# Patient Record
Sex: Male | Born: 1980 | Hispanic: No | Marital: Single | Smoking: Never smoker
Health system: Southern US, Community
[De-identification: ages and names within clinical notes are randomized; demographics above are authoritative.]

## PROBLEM LIST (undated history)

## (undated) DIAGNOSIS — N200 Calculus of kidney: Secondary | ICD-10-CM

## (undated) HISTORY — DX: Calculus of kidney: N20.0

---

## 2007-11-17 ENCOUNTER — Emergency Department (HOSPITAL_COMMUNITY): Admission: EM | Admit: 2007-11-17 | Discharge: 2007-11-17 | Payer: Self-pay | Admitting: *Deleted

## 2009-01-05 ENCOUNTER — Emergency Department (HOSPITAL_COMMUNITY): Admission: EM | Admit: 2009-01-05 | Discharge: 2009-01-05 | Payer: Self-pay | Admitting: Family Medicine

## 2009-05-11 ENCOUNTER — Ambulatory Visit (HOSPITAL_COMMUNITY): Admission: RE | Admit: 2009-05-11 | Discharge: 2009-05-11 | Payer: Self-pay | Admitting: Urology

## 2009-12-29 IMAGING — CR DG ABDOMEN 1V
1 series · 1 of 1 positions shown · non-contrast
Comparison: None

CLINICAL DATA: Preop for right ureteral stone

ABDOMEN - 1 VIEW

[t abdomen supine]
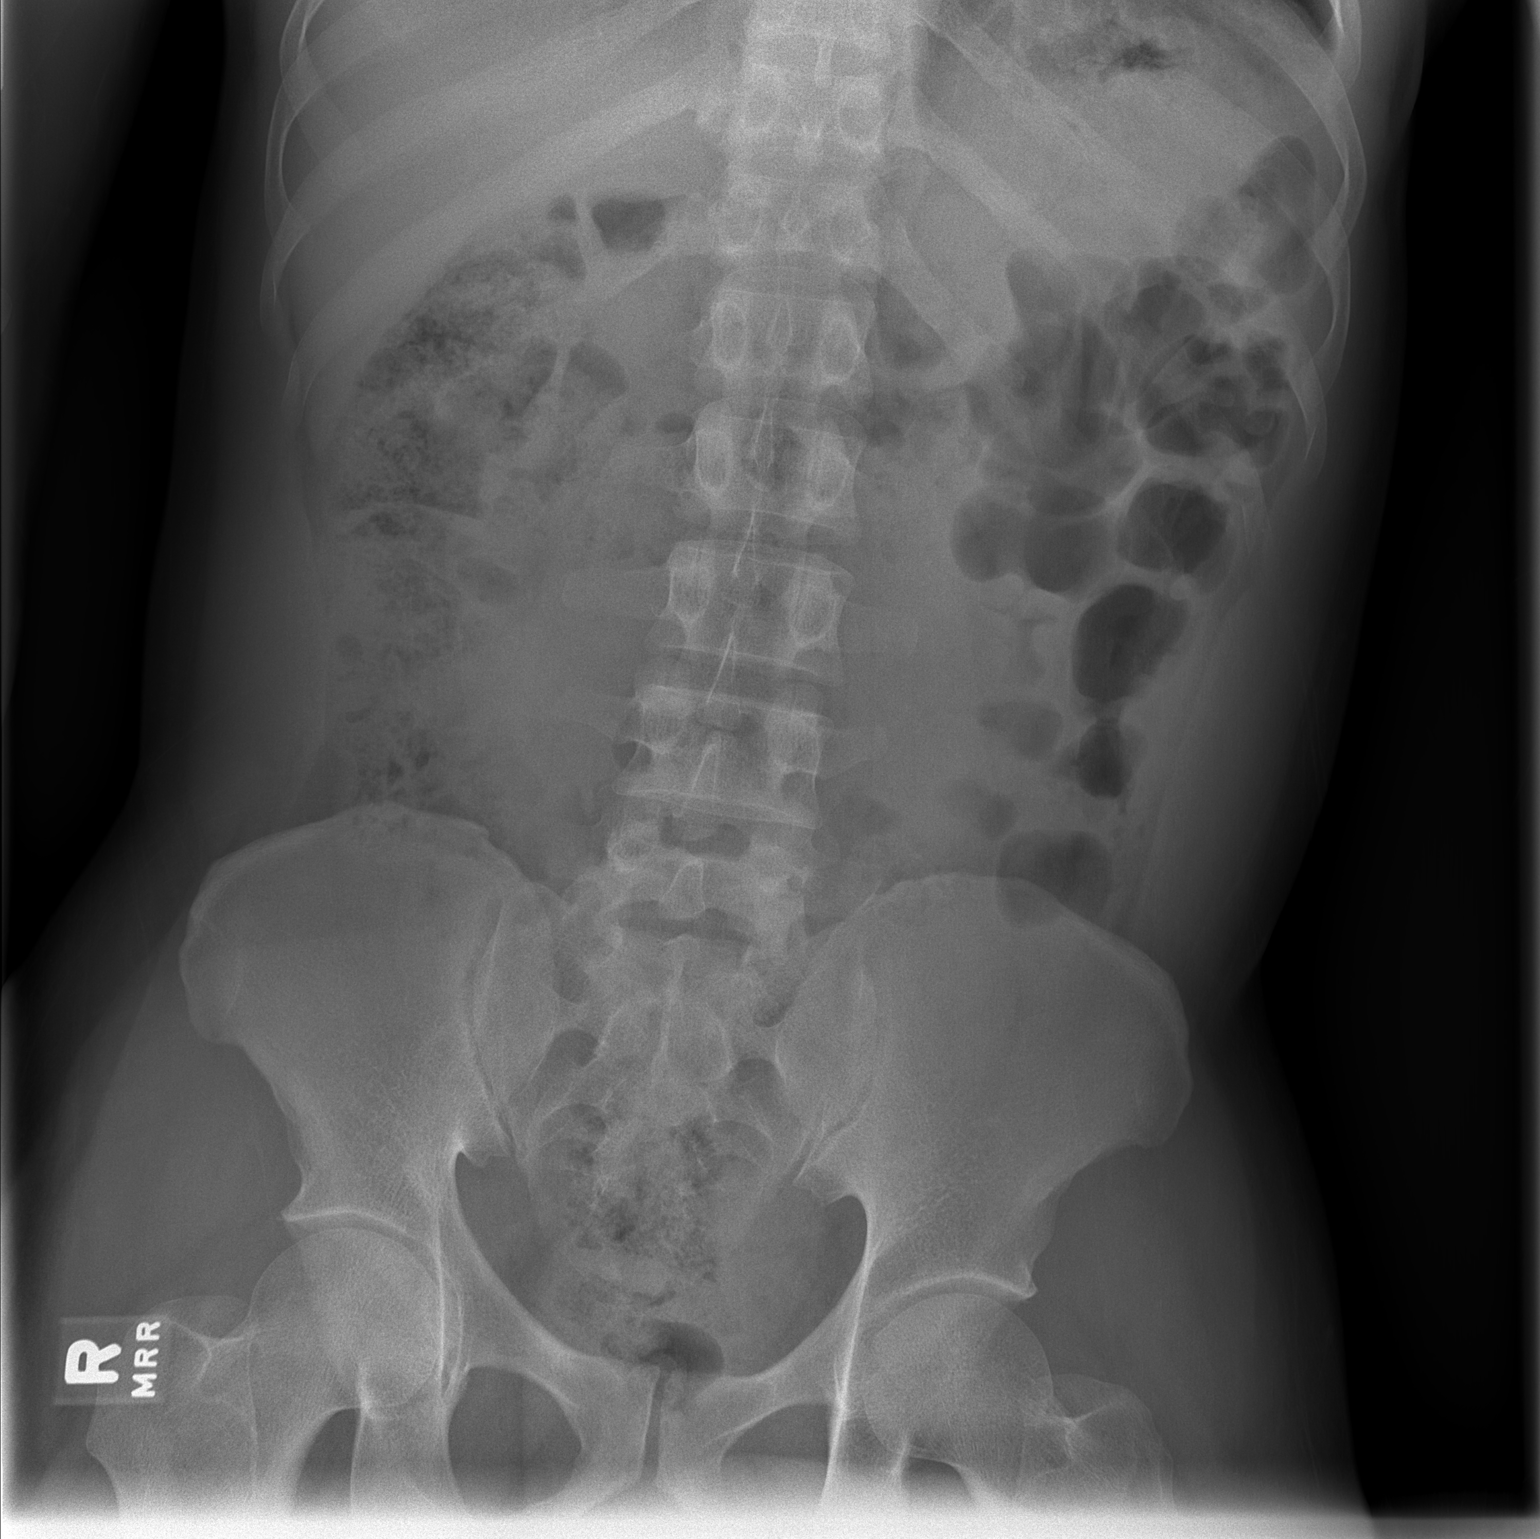

[1 of 1 positions shown; findings below may reference images not displayed]

FINDINGS: The bowel gas pattern is nonspecific.  No definite renal
calculi are seen although both renal outlines are overlain by bowel
gas and feces obscuring detail.  No definite calcification is seen
along the expected courses of the ureters.  The bones appear
normal.
IMPRESSION: Nonspecific bowel gas pattern.  No definite opaque calculus is
seen.  Recommend direct correlation with any prior CT of the
abdomen pelvis.

## 2010-12-17 ENCOUNTER — Other Ambulatory Visit: Payer: Self-pay | Admitting: Occupational Medicine

## 2010-12-17 ENCOUNTER — Ambulatory Visit: Payer: Self-pay

## 2010-12-17 DIAGNOSIS — R7611 Nonspecific reaction to tuberculin skin test without active tuberculosis: Secondary | ICD-10-CM

## 2011-03-25 NOTE — Op Note (Signed)
NAMERUMEAL, CULLIPHER              ACCOUNT NO.:  [id number]   MEDICAL RECORD NO.:  [id number]          PATIENT TYPE:  AMB   LOCATION:  DAY                          FACILITY:  [facility]   PHYSICIAN:  Mark C. Vernie Ammons, M.D.  DATE OF BIRTH:  1981-08-17   DATE OF PROCEDURE:  05/11/2009  DATE OF DISCHARGE:                               OPERATIVE REPORT   PREOPERATIVE DIAGNOSIS:  Right ureteral calculus.   POSTOPERATIVE DIAGNOSIS:  Right ureteral calculus.   PROCEDURES:  1. Cystoscopy with right ureteral catheterization.  2. Right retrograde pyelogram with interpretation.  3. Right ureteroscopy.  4. In situ laser lithotripsy.  5. Right ureteral stone extraction.  6. Right double-J stent placement (5-French, 24 cm with string).   SURGEON:  Dr. Vernie Ammons.   ANESTHESIA:  General.   SPECIMENS:  Stone given to the patient.   BLOOD LOSS:  Minimal.   DRAINS:  Polaris stent as above.   COMPLICATIONS:  None.   INDICATIONS:  The patient is a 30 year old male who was found to have a  6.5 mm right ureteral calculus after experiencing abdominal and right  lower quadrant pain.  Follow-up KUB revealed that the stone was not  easily identifiable along the course of the ureter, however, follow-up  yesterday revealed a change in the patient's symptoms in that he was  starting to have irritative voiding symptoms.  A KUB revealed a density  near the ureterovesical junction.  The patient has been out of work  since he began having difficulty with the stone and his employer would  not let him return until the stone was passed or treated.  He is  therefore brought to the operating room today for ureteroscopic  extraction of his stone and I have discussed the procedure, its risks  and complications, as well as alternatives.  The patient understands and  has elected to proceed.   DESCRIPTION OF OPERATION:  After informed consent, the patient was  brought to the major OR, placed on the table, administered  general  anesthesia, then moved to the dorsal lithotomy position.  His genitalia  was sterilely prepped and draped and an official timeout was then  performed.   A 22-French cystoscope with 12 degrees lens was then passed per urethra  which was noted to be entirely normal throughout its length.  The  prostatic urethra was noted to be nonobstructing and had no lesions.  Upon entering the bladder, I noted normal-appearing mucosa and upon full  systematic inspection, I noted no tumor, stones or inflammatory lesions.  The ureteral orifices were noted to be in  normal position.  The left  orifice appeared normal.  The right orifice appeared edematous and  slightly mounded up, consistent with an intramural ureteral calculus.   A 6-French open-ended ureteral catheter was then passed into the opening  of the right ureteral orifice and under direct fluoroscopic  visualization, full-strength contrast was injected to perform a  retrograde pyelogram.  This revealed a filling defect in the distal  ureter, but a normal ureter proximal to that with no filling defects or  masses.  The intrarenal  collecting system was also noted to be normal.   A 0.038 inch floppy tip guidewire was then passed up the ureter under  fluoroscopic control into the area of the renal pelvis and the open-  ended catheter was removed.  The end portion of a ureteral access sheath  was then passed over the guidewire to gently dilate the intramural  ureter.  I then removed that and performed ureteroscopy.   The 6-French rigid ureteroscope was then passed next to the guidewire  and into the right ureteral orifice where the stone was identified.  A  200 micron holmium laser fiber was then used to fragment the stone.   The nitinol basket was then passed through the ureteroscope and the  stone fragments were grasped and extracted without difficulty.  I then  re-inspected the ureter and noted no injury or perforation.  No residual   fragments remained.   The guidewire was left in place and after the ureteroscope was removed,  the cystoscope was back loaded over the guidewire and the stent was  passed over the guidewire and as the guidewire was removed, a good curl  was noted in the area of the renal pelvis.  The bladder was then  drained, the urethra was instilled with 2% lidocaine jelly and a penile  clamp was applied and the string attached to the distal to distal  portion of the stent was then affixed to the dorsum of the penis.  The  patient was awakened and taken to the recovery room in stable and  satisfactory condition.  He tolerated the procedure well and there were  no intraoperative complications.   He will be discharged home with Pyridium 200 mg #36 and Vicodin HP #24.  He was given written instructions and will follow-up in my office next  week for his stent removal.      Mark C. Vernie Ammons, M.D.  Electronically Signed     MCO/MEDQ  D:  05/11/2009  T:  05/11/2009  Job:  [id number]

## 2014-01-14 ENCOUNTER — Emergency Department (HOSPITAL_COMMUNITY): Payer: 59

## 2014-01-14 ENCOUNTER — Emergency Department (HOSPITAL_COMMUNITY)
Admission: EM | Admit: 2014-01-14 | Discharge: 2014-01-15 | Disposition: A | Discharge status: Home / Self Care | Payer: 59 | Attending: Emergency Medicine | Admitting: Emergency Medicine

## 2014-01-14 ENCOUNTER — Encounter (HOSPITAL_COMMUNITY): Payer: Self-pay | Admitting: Emergency Medicine

## 2014-01-14 DIAGNOSIS — R61 Generalized hyperhidrosis: Secondary | ICD-10-CM | POA: Insufficient documentation

## 2014-01-14 DIAGNOSIS — Z8701 Personal history of pneumonia (recurrent): Secondary | ICD-10-CM | POA: Insufficient documentation

## 2014-01-14 DIAGNOSIS — R0789 Other chest pain: Secondary | ICD-10-CM

## 2014-01-14 DIAGNOSIS — R509 Fever, unspecified: Secondary | ICD-10-CM | POA: Insufficient documentation

## 2014-01-14 LAB — I-STAT CHEM 8, ED
BUN: 20 mg/dL (ref 6–23)
CALCIUM ION: 1.24 mmol/L — AB (ref 1.12–1.23)
CREATININE: 1.3 mg/dL (ref 0.50–1.35)
Chloride: 102 mEq/L (ref 96–112)
GLUCOSE: 91 mg/dL (ref 70–99)
HEMATOCRIT: 44 % (ref 39.0–52.0)
HEMOGLOBIN: 15 g/dL (ref 13.0–17.0)
Potassium: 3.7 mEq/L (ref 3.7–5.3)
Sodium: 142 mEq/L (ref 137–147)
TCO2: 28 mmol/L (ref 0–100)

## 2014-01-14 NOTE — ED Provider Notes (Signed)
CSN: [id number]     Arrival date & time 01/14/14  2156 History  This chart was scribed for non-physician practitioner working with Sunnie Nielsen, MD by Ashley Jacobs, ED scribe. This patient was seen in room WTR8/WTR8 and the patient's care was started at 11:06 PM.  First MD Initiated Contact with Patient 01/14/14 2256     Chief Complaint  Patient presents with  . Chest Congestion      (Consider location/radiation/quality/duration/timing/severity/associated sxs/prior Treatment) The history is provided by the patient, medical records and the spouse. No language interpreter was used.   HPI Comments: Jonathan Nicholson is a 33 y.o. male who presents to the Emergency Department complaining of intermittent upper chest pain.   He describes the pain as pressure and nothing seems to make the pain worse.  Pt was treated for pneumonia  three-four weeks ago and he reports the pain is similar in nature. He mention that he experiences chills and diaphoresis that occurs at night. Denies coughing blood sputum.  Denies cough, wheezing, and SOB. Denies being in any current pain. Nothing makes the pain worse. He has tried Aleve and reports having mild relief. The pain does not radiate to his back. Denies abdominal pain. History reviewed. No pertinent past medical history. History reviewed. No pertinent past surgical history. No family history on file. History  Substance Use Topics  . Smoking status: Never Smoker   . Smokeless tobacco: Never Used  . Alcohol Use: No    Review of Systems  Constitutional: Positive for fever and diaphoresis.  Respiratory: Negative for cough, shortness of breath and wheezing.   Cardiovascular: Positive for chest pain.  Gastrointestinal: Negative for abdominal pain.  Musculoskeletal: Negative for gait problem.  All other systems reviewed and are negative.      Allergies  Review of patient's allergies indicates no known allergies.  Home Medications  No current outpatient  prescriptions on file. BP 125/70  Pulse 67  Temp(Src) 97.8 F (36.6 C) (Oral)  Resp 18  SpO2 99% Physical Exam  Nursing note and vitals reviewed. Constitutional: He is oriented to person, place, and time. He appears well-developed and well-nourished. No distress.  HENT:  Head: Normocephalic and atraumatic.  Right Ear: External ear normal.  Left Ear: External ear normal.  Nose: Nose normal.  Mouth/Throat: Oropharynx is clear and moist. No oropharyngeal exudate.  Eyes: Conjunctivae are normal. Pupils are equal, round, and reactive to light. No scleral icterus.  Neck: Normal range of motion. Neck supple.  Cardiovascular: Normal rate, regular rhythm and normal heart sounds.  Exam reveals no gallop and no friction rub.   No murmur heard. Pulmonary/Chest: Effort normal and breath sounds normal. No respiratory distress. He has no wheezes. He has no rales. He exhibits no tenderness.  Abdominal: Soft. Bowel sounds are normal. He exhibits no distension. There is no tenderness. There is no rebound and no guarding.  Musculoskeletal: Normal range of motion. He exhibits no edema and no tenderness.  Lymphadenopathy:    He has no cervical adenopathy.  Neurological: He is alert and oriented to person, place, and time. He exhibits normal muscle tone. Coordination normal.  Skin: Skin is warm and dry. No rash noted. No erythema. No pallor.  Psychiatric: He has a normal mood and affect. His behavior is normal. Judgment and thought content normal.    ED Course  Procedures (including critical care time) DIAGNOSTIC STUDIES: Oxygen Saturation is 99% on room air, normal by my interpretation.    COORDINATION OF CARE:  11:10 PM  Discussed course of care with pt which includes chest x-ray, EKG and laboratory tests. Pt understands and agrees.   Labs Review Labs Reviewed - No data to display Imaging Review No results found.   EKG Interpretation None     Orders placed during the hospital encounter of  01/14/14  . ED EKG  . ED EKG  . EKG 12-LEAD  . EKG 12-LEAD   Date: 01/14/2014  Rate: 69  Rhythm: normal sinus rhythm  QRS Axis: normal  Intervals: normal  ST/T Wave abnormalities: normal  Conduction Disutrbances:none  Narrative Interpretation: LVH, otherwise normal EKG, reviewed by Dr. Ruthy DickZachowski  Old EKG Reviewed: none available Results for orders placed during the hospital encounter of 01/14/14  CBC WITH DIFFERENTIAL      Result Value Ref Range   WBC 4.8  4.0 - 10.5 K/uL   RBC 4.97  4.22 - 5.81 MIL/uL   Hemoglobin 13.7  13.0 - 17.0 g/dL   HCT 16.140.6  09.639.0 - 04.552.0 %   MCV 81.7  78.0 - 100.0 fL   MCH 27.6  26.0 - 34.0 pg   MCHC 33.7  30.0 - 36.0 g/dL   RDW 40.913.6  81.111.5 - 91.415.5 %   Platelets 249  150 - 400 K/uL   Neutrophils Relative % 33 (*) 43 - 77 %   Neutro Abs 1.6 (*) 1.7 - 7.7 K/uL   Lymphocytes Relative 55 (*) 12 - 46 %   Lymphs Abs 2.6  0.7 - 4.0 K/uL   Monocytes Relative 9  3 - 12 %   Monocytes Absolute 0.4  0.1 - 1.0 K/uL   Eosinophils Relative 3  0 - 5 %   Eosinophils Absolute 0.1  0.0 - 0.7 K/uL   Basophils Relative 0  0 - 1 %   Basophils Absolute 0.0  0.0 - 0.1 K/uL  I-STAT CHEM 8, ED      Result Value Ref Range   Sodium 142  137 - 147 mEq/L   Potassium 3.7  3.7 - 5.3 mEq/L   Chloride 102  96 - 112 mEq/L   BUN 20  6 - 23 mg/dL   Creatinine, Ser 7.821.30  0.50 - 1.35 mg/dL   Glucose, Bld 91  70 - 99 mg/dL   Calcium, Ion 9.561.24 (*) 1.12 - 1.23 mmol/L   TCO2 28  0 - 100 mmol/L   Hemoglobin 15.0  13.0 - 17.0 g/dL   HCT 21.344.0  08.639.0 - 57.852.0 %  I-STAT TROPOININ, ED      Result Value Ref Range   Troponin i, poc 0.00  0.00 - 0.08 ng/mL   Comment 3            Dg Chest 2 View  01/14/2014   CLINICAL DATA:  Left-sided chest pain. Recent pneumonia. No chest complaints.  EXAM: CHEST  2 VIEW  COMPARISON:  DG CHEST 2 VIEW dated 12/17/2010  FINDINGS: The heart size and mediastinal contours are within normal limits. Both lungs are clear. The visualized skeletal structures are  unremarkable. No change from 2012.  IMPRESSION: No active cardiopulmonary disease.   Electronically Signed   By: Davonna BellingJohn  Curnes M.D.   On: 01/14/2014 23:06      MDM   Atypical chest pain  Patient here with left anterior chest pain that started about 4 days ago.  There is no evidence of CAD, x-ray negative for continued infection, no current risk factors for TB, patient without pain at this time.  Will follow up with  PCP.  I personally performed the services described in this documentation, which was scribed in my presence. The recorded information has been reviewed and is accurate.'     Scarlette Calico C. Marisue Humble, PA-C 01/15/14 0124

## 2014-01-14 NOTE — ED Notes (Addendum)
Pt reports having chest congestion with chest discomfort, which he had while diagnosed with pneumonia in January. Pt reports the chest discomfort improved with antibiotics, however now is returning. Pt is A/O x4, in NAD, and vitals are WDL.

## 2014-01-15 LAB — CBC WITH DIFFERENTIAL/PLATELET
BASOS PCT: 0 % (ref 0–1)
Basophils Absolute: 0 10*3/uL (ref 0.0–0.1)
EOS ABS: 0.1 10*3/uL (ref 0.0–0.7)
EOS PCT: 3 % (ref 0–5)
HCT: 40.6 % (ref 39.0–52.0)
HEMOGLOBIN: 13.7 g/dL (ref 13.0–17.0)
Lymphocytes Relative: 55 % — ABNORMAL HIGH (ref 12–46)
Lymphs Abs: 2.6 10*3/uL (ref 0.7–4.0)
MCH: 27.6 pg (ref 26.0–34.0)
MCHC: 33.7 g/dL (ref 30.0–36.0)
MCV: 81.7 fL (ref 78.0–100.0)
MONO ABS: 0.4 10*3/uL (ref 0.1–1.0)
MONOS PCT: 9 % (ref 3–12)
NEUTROS PCT: 33 % — AB (ref 43–77)
Neutro Abs: 1.6 10*3/uL — ABNORMAL LOW (ref 1.7–7.7)
Platelets: 249 10*3/uL (ref 150–400)
RBC: 4.97 MIL/uL (ref 4.22–5.81)
RDW: 13.6 % (ref 11.5–15.5)
WBC: 4.8 10*3/uL (ref 4.0–10.5)

## 2014-01-15 LAB — I-STAT TROPONIN, ED: Troponin i, poc: 0 ng/mL (ref 0.00–0.08)

## 2014-01-15 MED ORDER — IBUPROFEN 800 MG PO TABS: 800.0000 mg | ORAL_TABLET | Freq: Three times a day (TID) | ORAL | Status: AC

## 2014-01-15 NOTE — Discharge Instructions (Signed)
Chest Wall Pain °Chest wall pain is pain in or around the bones and muscles of your chest. It may take up to 6 weeks to get better. It may take longer if you must stay physically active in your work and activities.  °CAUSES  °Chest wall pain may happen on its own. However, it may be caused by: °· A viral illness like the flu. °· Injury. °· Coughing. °· Exercise. °· Arthritis. °· Fibromyalgia. °· Shingles. °HOME CARE INSTRUCTIONS  °· Avoid overtiring physical activity. Try not to strain or perform activities that cause pain. This includes any activities using your chest or your abdominal and side muscles, especially if heavy weights are used. °· Put ice on the sore area. °· Put ice in a plastic bag. °· Place a towel between your skin and the bag. °· Leave the ice on for 15-20 minutes per hour while awake for the first 2 days. °· Only take over-the-counter or prescription medicines for pain, discomfort, or fever as directed by your caregiver. °SEEK IMMEDIATE MEDICAL CARE IF:  °· Your pain increases, or you are very uncomfortable. °· You have a fever. °· Your chest pain becomes worse. °· You have new, unexplained symptoms. °· You have nausea or vomiting. °· You feel sweaty or lightheaded. °· You have a cough with phlegm (sputum), or you cough up blood. °MAKE SURE YOU:  °· Understand these instructions. °· Will watch your condition. °· Will get help right away if you are not doing well or get worse. °Document Released: 10/27/2005 Document Revised: 01/19/2012 Document Reviewed: 06/23/2011 °ExitCare® Patient Information ©2014 [employer]. °Musculoskeletal Pain °Musculoskeletal pain is muscle and boney aches and pains. These pains can occur in any part of the body. Your caregiver may treat you without knowing the cause of the pain. They may treat you if blood or urine tests, X-rays, and other tests were normal.  °CAUSES °There is often not a definite cause or reason for these pains. These pains may be caused by a type of  germ (virus). The discomfort may also come from overuse. Overuse includes working out too hard when your body is not fit. Boney aches also come from weather changes. Bone is sensitive to atmospheric pressure changes. °HOME CARE INSTRUCTIONS  °Ask when your test results will be ready. Make sure you get your test results. °Only take over-the-counter or prescription medicines for pain, discomfort, or fever as directed by your caregiver. If you were given medications for your condition, do not drive, operate machinery or power tools, or sign legal documents for 24 hours. Do not drink alcohol. Do not take sleeping pills or other medications that may interfere with treatment. °Continue all activities unless the activities cause more pain. When the pain lessens, slowly resume normal activities. Gradually increase the intensity and duration of the activities or exercise. °During periods of severe pain, bed rest may be helpful. Lay or sit in any position that is comfortable. °Putting ice on the injured area. °Put ice in a bag. °Place a towel between your skin and the bag. °Leave the ice on for 15 to 20 minutes, 3 to 4 times a day. °Follow up with your caregiver for continued problems and no reason can be found for the pain. If the pain becomes worse or does not go away, it may be necessary to repeat tests or do additional testing. Your caregiver may need to look further for a possible cause. °SEEK IMMEDIATE MEDICAL CARE IF: °You have pain that is getting worse   and is not relieved by medications. °You develop chest pain that is associated with shortness or breath, sweating, feeling sick to your stomach (nauseous), or throw up (vomit). °Your pain becomes localized to the abdomen. °You develop any new symptoms that seem different or that concern you. °MAKE SURE YOU:  °Understand these instructions. °Will watch your condition. °Will get help right away if you are not doing well or get worse. °Document Released: 10/27/2005  Document Revised: 01/19/2012 Document Reviewed: 07/01/2013 °ExitCare® Patient Information ©2014 [employer]. ° °

## 2014-01-15 NOTE — ED Provider Notes (Signed)
Medical screening examination/treatment/procedure(s) were performed by non-physician practitioner and as supervising physician I was immediately available for consultation/collaboration.   Montray Kliebert, MD 01/15/14 0810 

## 2015-08-20 ENCOUNTER — Ambulatory Visit (INDEPENDENT_AMBULATORY_CARE_PROVIDER_SITE_OTHER): Payer: Self-pay | Admitting: Emergency Medicine

## 2015-08-20 VITALS — BP 126/80 | HR 83 | Temp 98.2°F | Resp 18 | Ht 64.0 in | Wt 180.0 lb

## 2015-08-20 DIAGNOSIS — Z021 Encounter for pre-employment examination: Secondary | ICD-10-CM

## 2015-08-20 DIAGNOSIS — Z0000 Encounter for general adult medical examination without abnormal findings: Secondary | ICD-10-CM

## 2015-08-20 NOTE — Progress Notes (Signed)
   Subjective:  This chart was scribed for Lesle Chris MD, by Veverly Fells, at [facility].  This patient was seen in room 8 and the patient's care was started at 12:57 PM.    Patient ID: Jonathan Nicholson, male    DOB: 02-Aug-1981, 34 y.o.   MRN: [id number]  Chief Complaint  Patient presents with  . Employment Physical    dot     HPI  HPI Comments: Jonathan Nicholson is a 34 y.o. 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  Patient had a kidney stone removed in 2010. He does not use any medications.  He currently drives long distances.  He has no other questions or concerns today.     There are no active problems to display for this patient.  Past Medical History  Diagnosis Date  . Kidney stone    History reviewed. No pertinent past surgical history. No Known Allergies Prior to Admission medications   Medication Sig Start Date End Date Taking? Authorizing Provider  ibuprofen (ADVIL,MOTRIN) 200 MG tablet Take 200 mg by mouth every 6 (six) hours as needed.    Historical Provider, MD  ibuprofen (ADVIL,MOTRIN) 800 MG tablet Take 1 tablet (800 mg total) by mouth 3 (three) times daily. Patient not taking: Reported on 08/20/2015 01/15/14   Cherrie Distance, PA-C   Social History   Social History  . Marital Status: Single    Spouse Name: N/A  . Number of Children: N/A  . Years of Education: N/A   Occupational History  . Not on file.   Social History Main Topics  . Smoking status: Never Smoker   . Smokeless tobacco: Never Used  . Alcohol Use: No  . Drug Use: No  . Sexual Activity: Not on file   Other Topics Concern  . Not on file   Social History Narrative     Review of Systems  Constitutional: Negative for fever and chills.  Eyes: Negative for pain, discharge, redness and itching.  Respiratory: Negative for cough, choking and shortness of breath.   Gastrointestinal: Negative for nausea and vomiting.  Musculoskeletal:  Negative for neck pain and neck stiffness.  Skin: Negative for rash and wound.       Objective:   Physical Exam  Filed Vitals:   08/20/15 1213  BP: 126/80  Pulse: 83  Temp: 98.2 F (36.8 C)  TempSrc: Oral  Resp: 18  Height:  (1.626 m)  Weight: 180 lb (81.647 kg)  SpO2: 99%     CONSTITUTIONAL: Well developed/well nourished HEAD: Normocephalic/atraumatic EYES: EOMI/PERRL ENMT: Mucous membranes moist NECK: supple no meningeal signs SPINE/BACK:entire spine nontender CV: S1/S2 noted, no murmurs/rubs/gallops noted LUNGS: Lungs are clear to auscultation bilaterally, no apparent distress ABDOMEN: soft, nontender, no rebound or guarding, bowel sounds noted throughout abdomen GU:no cva tenderness NEURO: Pt is awake/alert/appropriate, moves all extremitiesx4.  No facial droop.   EXTREMITIES: pulses normal/equal, full ROM SKIN: warm, color normal PSYCH: no abnormalities of mood noted, alert and oriented to situation       Assessment & Plan:  Patient qualifies for a 2 year card  No restrictions.I personally performed the services described in this documentation, which was scribed in my presence. The recorded information has been reviewed and is accurate.

## 2024-05-01 ENCOUNTER — Emergency Department (HOSPITAL_BASED_OUTPATIENT_CLINIC_OR_DEPARTMENT_OTHER)
Admission: EM | Admit: 2024-05-01 | Discharge: 2024-05-01 | Disposition: A | Discharge status: Home / Self Care | Payer: Self-pay | Attending: Emergency Medicine | Admitting: Emergency Medicine

## 2024-05-01 ENCOUNTER — Encounter (HOSPITAL_BASED_OUTPATIENT_CLINIC_OR_DEPARTMENT_OTHER): Payer: Self-pay | Admitting: Emergency Medicine

## 2024-05-01 DIAGNOSIS — I10 Essential (primary) hypertension: Secondary | ICD-10-CM | POA: Insufficient documentation

## 2024-05-01 DIAGNOSIS — G478 Other sleep disorders: Secondary | ICD-10-CM | POA: Insufficient documentation

## 2024-05-01 DIAGNOSIS — R5383 Other fatigue: Secondary | ICD-10-CM | POA: Insufficient documentation

## 2024-05-01 LAB — CBC WITH DIFFERENTIAL/PLATELET
Abs Immature Granulocytes: 0.01 10*3/uL (ref 0.00–0.07)
Basophils Absolute: 0 10*3/uL (ref 0.0–0.1)
Basophils Relative: 1 %
Eosinophils Absolute: 0.1 10*3/uL (ref 0.0–0.5)
Eosinophils Relative: 2 %
HCT: 45.1 % (ref 39.0–52.0)
Hemoglobin: 15.1 g/dL (ref 13.0–17.0)
Immature Granulocytes: 0 %
Lymphocytes Relative: 37 %
Lymphs Abs: 1.7 10*3/uL (ref 0.7–4.0)
MCH: 27.4 pg (ref 26.0–34.0)
MCHC: 33.5 g/dL (ref 30.0–36.0)
MCV: 81.9 fL (ref 80.0–100.0)
Monocytes Absolute: 0.4 10*3/uL (ref 0.1–1.0)
Monocytes Relative: 9 %
Neutro Abs: 2.3 10*3/uL (ref 1.7–7.7)
Neutrophils Relative %: 51 %
Platelets: 301 10*3/uL (ref 150–400)
RBC: 5.51 MIL/uL (ref 4.22–5.81)
RDW: 13.2 % (ref 11.5–15.5)
WBC: 4.5 10*3/uL (ref 4.0–10.5)
nRBC: 0 % (ref 0.0–0.2)

## 2024-05-01 LAB — BASIC METABOLIC PANEL WITH GFR
Anion gap: 14 (ref 5–15)
BUN: 9 mg/dL (ref 6–20)
CO2: 24 mmol/L (ref 22–32)
Calcium: 10.2 mg/dL (ref 8.9–10.3)
Chloride: 102 mmol/L (ref 98–111)
Creatinine, Ser: 1.1 mg/dL (ref 0.61–1.24)
GFR, Estimated: >60 mL/min (ref >60)
Glucose, Bld: 100 mg/dL — ABNORMAL HIGH (ref 70–99)
Potassium: 3.7 mmol/L (ref 3.5–5.1)
Sodium: 139 mmol/L (ref 135–145)

## 2024-05-01 LAB — TSH: TSH: 0.664 u[IU]/mL (ref 0.350–4.500)

## 2024-05-01 MED ORDER — UNISOM SLEEPGELS 50 MG PO CAPS: 1.0000 | ORAL_CAPSULE | ORAL | 0 refills | Status: AC

## 2024-05-01 MED ORDER — AMLODIPINE BESYLATE 5 MG PO TABS: 5.0000 mg | ORAL_TABLET | Freq: Every day | ORAL | 1 refills | Status: AC

## 2024-05-01 MED ORDER — MELATONIN 1 MG PO CAPS: 1.0000 | ORAL_CAPSULE | ORAL | 0 refills | Status: AC

## 2024-05-01 NOTE — Discharge Instructions (Signed)
 As discussed, your blood test today appeared normal.  We will begin you on blood pressure medicine in the form of amlodipine to take as your blood pressure is elevated today.  Recommend follow-up with your primary care for reassessment regarding her blood pressure.  Recommend logging your blood pressure at home at least once in the morning once in the evening and having this data available for your primary care provider.  Also begin 2 different medicines to help with sleeping.  You are Unisom as well as melatonin to take as needed to help with sleeping.  Recommend follow-up with your primary care as there is concern that you may have sleep apnea.  Call number test your discharge papers to schedule appoint with primary care.  Please do not hesitate to return to emergency department if the worrisome signs and symptoms we discussed become apparent.

## 2024-05-01 NOTE — ED Provider Notes (Signed)
 [place] EMERGENCY DEPARTMENT AT [facility] Provider Note   CSN: [id number] Arrival date & time: 05/01/24  1324     Patient presents with: Hypertension and Fatigue   Jonathan Nicholson is a 43 y.o. male.    Hypertension   43 year old male presents emergency department complaints of hypertension.  States that he had elevated blood pressure at urgent care earlier today.  States that for the past 3 weeks, has been waking up feeling generalized fatigue.  States that he is able to perform his ADLs but is tired in general.  States he has not been sleeping very well.  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  States that he wakes up frequently during the night and snores very loudly.  Denies any fever, chills, cough, ingestion, abdominal pain, nausea, vomiting, urinary symptoms, change in bowel habits.  Patient feels like his generalized fatigue is likely related to his sleep.  No significant pertinent past medical history.  Prior to Admission medications   Medication Sig Start Date End Date Taking? Authorizing Provider  amLODipine (NORVASC) 5 MG tablet Take 1 tablet (5 mg total) by mouth daily. 05/01/24  Yes Silver Fell A, PA  diphenhydrAMINE HCl, Sleep, (UNISOM SLEEPGELS) 50 MG CAPS Take 1 capsule (50 mg total) by mouth See admin instructions. Nightly as needed for difficulty sleeping 05/01/24  Yes Silver Fell A, PA  Melatonin 1 MG CAPS Take 1 capsule (1 mg total) by mouth See admin instructions. Take 30 minutes prior to bedtime as needed for difficulty sleeping. 05/01/24  Yes Silver Fell A, PA  ibuprofen  (ADVIL ,MOTRIN ) 200 MG tablet Take 200 mg by mouth every 6 (six) hours as needed.    [provider]  ibuprofen  (ADVIL ,MOTRIN ) 800 MG tablet Take 1 tablet (800 mg total) by mouth 3 (three) times daily. Patient not taking: Reported on 08/20/2015 01/15/14   Marlee Mering, PA-C    Allergies: Patient  has no known allergies.    Review of Systems  All other systems reviewed and are negative.   Updated Vital Signs BP (!) 156/102 (BP Location: Right Arm)   Pulse 97   Temp 98.4 F (36.9 C)   Resp 18   SpO2 99%   Physical Exam Vitals and nursing note reviewed.  Constitutional:      General: He is not in acute distress.    Appearance: He is well-developed.  HENT:     Head: Normocephalic and atraumatic.   Eyes:     Conjunctiva/sclera: Conjunctivae normal.    Cardiovascular:     Rate and Rhythm: Normal rate and regular rhythm.     Heart sounds: No murmur heard. Pulmonary:     Effort: Pulmonary effort is normal. No respiratory distress.     Breath sounds: Normal breath sounds. No wheezing, rhonchi or rales.  Abdominal:     Palpations: Abdomen is soft.     Tenderness: There is no abdominal tenderness. There is no guarding.   Musculoskeletal:        General: No swelling.     Cervical back: Neck supple.   Skin:    General: Skin is warm and dry.     Capillary Refill: Capillary refill takes less than 2 seconds.   Neurological:     Mental Status: He is alert.   Psychiatric:        Mood and Affect: Mood normal.     (all labs ordered are  listed, but only abnormal results are displayed) Labs Reviewed  BASIC METABOLIC PANEL WITH GFR - Abnormal; Notable for the following components:      Result Value   Glucose, Bld 100 (*)    All other components within normal limits  CBC WITH DIFFERENTIAL/PLATELET  TSH    EKG: None  Radiology: No results found.   Procedures   Medications Ordered in the ED - No data to display                                  Medical Decision Making Amount and/or Complexity of Data Reviewed Labs: ordered.  Risk OTC drugs. Prescription drug management.   This patient presents to the ED for concern of generalized fatigue, elevated blood pressure, this involves an extensive number of treatment options, and is a complaint that carries  with it a high risk of complications and morbidity.  The differential diagnosis includes hypertension, endorgan damage, UTI, pneumonia, viral URI, thyroid abnormality, metabolic derangement, medication side effect/substance use, other   Co morbidities that complicate the patient evaluation  See HPI   Additional history obtained:  Additional history obtained from EMR External records from outside source obtained and reviewed including hospital records   Lab Tests:  I Ordered, and personally interpreted labs.  The pertinent results include: No leukocytosis.  No evidence of anemia.  Platelets within range.  No Electra abnormalities.  No renal dysfunction.  TSH pending.   Imaging Studies ordered:  N/a   Cardiac Monitoring: / EKG:  The patient was maintained on a cardiac monitor.  I personally viewed and interpreted the cardiac monitored which showed an underlying rhythm of: sinus rhythm   Consultations Obtained:  N/a   Problem List / ED Course / Critical interventions / Medication management  Difficulty sleeping, generalized fatigue, hypertension Reevaluation of the patient showed that the patient stayed the same I have reviewed the patients home medicines and have made adjustments as needed   Social Determinants of Health:  Denies tobacco, licit drug use.   Test / Admission - Considered:  Difficulty sleeping, generalized fatigue, hypertension Vitals signs significant for hypertension blood pressure 156/102. Otherwise within normal range and stable throughout visit. Laboratory studies significant for: See above 43 year old male presents emergency department complaints of hypertension.  States that he had elevated blood pressure at urgent care earlier today.  States that for the past 3 weeks, has been waking up feeling generalized fatigue.  States that he is able to perform his ADLs but is tired in general.  States he has not been sleeping very well.  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  States that he wakes up frequently during the night and snores very loudly.  Denies any fever, chills, cough, ingestion, abdominal pain, nausea, vomiting, urinary symptoms, change in bowel habits.  Patient feels like his generalized fatigue is likely related to his sleep. On exam, lungs clear to auscultation bilateral.  No abdominal tenderness.  Physical exam entirely benign.  Labs unremarkable for any acute emergent process.  Pending TSH.  Suspect the patient's generalized fatigue most likely coming from increasingly restless sleeping as symptoms seem to be corollary to difficulty sleeping over the past 3 weeks or so.  No obvious infectious etiology.  No obvious metabolic abnormality as cause.  Will send in TSH to see if it  is abnormal.  Will trial Unisom, melatonin for patient sleeping and recommend follow-up with PCP.  Also concerned that he may have some underlying sleep apnea given reported significant mild snoring at night.  Given patient's elevated blood pressure 156/102, will begin low-dose amlodipine and have him follow-up with primary care in the outpatient setting.  Recommend lifestyle/dietary changes as described in AVS.  Treatment plan discussed with patient and he acknowledged and was agreeable to said plan.  Patient will well-appearing, afebrile in no acute distress. Worrisome signs and symptoms were discussed with the patient, and the patient acknowledged understanding to return to the ED if noticed. Patient was stable upon discharge.       Final diagnoses:  Poor sleep pattern  Other fatigue  Hypertension, unspecified type    ED Discharge Orders          Ordered    amLODipine (NORVASC) 5 MG tablet  Daily        05/01/24 1527    diphenhydrAMINE HCl, Sleep, (UNISOM SLEEPGELS) 50 MG CAPS  See admin instructions        05/01/24 1527    Melatonin 1 MG CAPS  See admin instructions         05/01/24 1527               Silver Wonda LABOR, [place] 05/01/24 1535    Neysa Caron PARAS, DO 05/02/24 [id number]

## 2024-05-01 NOTE — ED Triage Notes (Signed)
 Not feeling well, not sleeping x 3 weeks Seen at [facility] and sent for high BP
